# Patient Record
Sex: Female | Born: 1995 | Race: White | Hispanic: No | Marital: Single | Smoking: Never smoker
Health system: Southern US, Community
[De-identification: ages and names within clinical notes are randomized; demographics above are authoritative.]

## PROBLEM LIST (undated history)

## (undated) DIAGNOSIS — Z86711 Personal history of pulmonary embolism: Secondary | ICD-10-CM

## (undated) DIAGNOSIS — J45909 Unspecified asthma, uncomplicated: Secondary | ICD-10-CM

---
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
## 2021-03-24 ENCOUNTER — Emergency Department
Admission: EM | Admit: 2021-03-24 | Discharge: 2021-03-24 | Disposition: A | Discharge status: Home / Self Care | Payer: BC Managed Care – PPO | Attending: Student in an Organized Health Care Education/Training Program | Admitting: Student in an Organized Health Care Education/Training Program

## 2021-03-24 ENCOUNTER — Emergency Department: Payer: BC Managed Care – PPO

## 2021-03-24 DIAGNOSIS — J45909 Unspecified asthma, uncomplicated: Secondary | ICD-10-CM | POA: Diagnosis not present

## 2021-03-24 DIAGNOSIS — R059 Cough, unspecified: Secondary | ICD-10-CM | POA: Diagnosis present

## 2021-03-24 DIAGNOSIS — R0989 Other specified symptoms and signs involving the circulatory and respiratory systems: Secondary | ICD-10-CM | POA: Insufficient documentation

## 2021-03-24 HISTORY — DX: Personal history of pulmonary embolism: Z86.711

## 2021-03-24 HISTORY — DX: Unspecified asthma, uncomplicated: J45.909

## 2021-03-24 LAB — BASIC METABOLIC PANEL
Anion gap: 8 (ref 5–15)
BUN: 10 mg/dL (ref 6–20)
CO2: 24 mmol/L (ref 22–32)
Calcium: 9.5 mg/dL (ref 8.9–10.3)
Chloride: 105 mmol/L (ref 98–111)
Creatinine, Ser: 0.76 mg/dL (ref 0.44–1.00)
GFR, Estimated: >60 mL/min (ref >60)
Glucose, Bld: 103 mg/dL — ABNORMAL HIGH (ref 70–99)
Potassium: 3.7 mmol/L (ref 3.5–5.1)
Sodium: 137 mmol/L (ref 135–145)

## 2021-03-24 LAB — CBC
HCT: 42.7 % (ref 36.0–46.0)
Hemoglobin: 14.4 g/dL (ref 12.0–15.0)
MCH: 27.2 pg (ref 26.0–34.0)
MCHC: 33.7 g/dL (ref 30.0–36.0)
MCV: 80.7 fL (ref 80.0–100.0)
Platelets: 396 10*3/uL (ref 150–400)
RBC: 5.29 MIL/uL — ABNORMAL HIGH (ref 3.87–5.11)
RDW: 13.2 % (ref 11.5–15.5)
WBC: 9.2 10*3/uL (ref 4.0–10.5)
nRBC: 0 % (ref 0.0–0.2)

## 2021-03-24 LAB — TROPONIN I (HIGH SENSITIVITY): Troponin I (High Sensitivity): <2 ng/L (ref <18)

## 2021-03-24 LAB — D-DIMER, QUANTITATIVE: D-Dimer, Quant: 0.45 ug/mL-FEU (ref 0.00–0.50)

## 2021-03-24 MED ORDER — PREDNISONE 10 MG (21) PO TBPK: ORAL_TABLET | ORAL | 0 refills | Status: AC

## 2021-03-24 MED ORDER — BENZONATATE 100 MG PO CAPS: 100.0000 mg | ORAL_CAPSULE | Freq: Three times a day (TID) | ORAL | 0 refills | Status: AC | PRN

## 2021-03-24 NOTE — Discharge Instructions (Addendum)
Take tapered steroid once you have been coughing for 6 to 7 days. Take Tessalon Perles for cough.

## 2021-03-24 NOTE — ED Triage Notes (Signed)
Pt states cough and congestion. Has had 4 negative covid tests. C/o sore throat. Hx PE, takes aspirin. A&O, ambulatory. No resp distress noted.

## 2021-03-24 NOTE — ED Provider Notes (Signed)
Emergency Medicine Provider Triage Evaluation Note  Denise Mueller , a 25 y.o. female  was evaluated in triage.  Pt complains of cough, congestion and endorsed shortness of breath. Denies any pain. Symptoms x 3-4 days. Reports mild bilateral leg swelling she thinks is related to her job, no unilateral swelling. 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 She was on Xarelto for 8 months and then transitioned to aspirin..  Review of Systems  Positive: Shortness of breath, cough, congestion Negative: Fever, chest pain  Physical Exam  BP (!) 141/87 (BP Location: Left Arm)   Pulse 91   Temp 98 F (36.7 C) (Oral)   Resp 16   Ht 5\' 6"  (1.676 m)   Wt 90.7 kg   LMP 03/03/2021   SpO2 100%   BMI 32.28 kg/m  Gen:   Awake, no distress  Resp:  Normal effort  MSK:   Moves extremities without difficulty  Other:    Medical Decision Making  Medically screening exam initiated at 3:47 PM.  Appropriate orders placed.  LAQUIDA COTRELL was informed that the remainder of the evaluation will be completed by another provider, this initial triage assessment does not replace that evaluation, and the importance of remaining in the ED until their evaluation is complete.     Denise Pimple, PA 03/24/21 1549    03/26/21, MD 03/24/21 2231

## 2021-04-08 NOTE — ED Provider Notes (Signed)
[facility]-EMERGENCY DEPARTMENT  ____________________________________________  Time seen: Approximately 3:02 PM  I have reviewed the triage vital signs and the nursing notes.   HISTORY  Chief Complaint Shortness of Breath and Cough   Historian Patient     HPI Denise Mueller is a 25 y.o. female presents emergency department with persistent cough and congestion for the past 4 to 5 days.  Patient has concerns for PE as she has had a history of PE in the past.  No upper back pain or shortness of breath.  She has had low-grade fever intermittently in the past.  No chest tightness.  No nausea, vomiting or abdominal pain.   Past Medical History:  Diagnosis Date   Asthma    History of pulmonary embolus (PE)      Immunizations up to date:  Yes.     Past Medical History:  Diagnosis Date   Asthma    History of pulmonary embolus (PE)     There are no problems to display for this patient.   History reviewed. No pertinent surgical history.  Prior to Admission medications   Medication Sig Start Date End Date Taking? Authorizing Provider  predniSONE (STERAPRED UNI-PAK 21 TAB) 10 MG (21) TBPK tablet Take 6 tablets the first day, take 5 tablets the second day, take 4 tablets the third day, take 3 tablets the fourth day, take 2 tablets the fifth day, take 1 tablet the sixth day. 03/24/21  Yes Pia Mau M, PA-C    Allergies Gabapentin  History reviewed. No pertinent family history.  Social History Social History   Tobacco Use   Smoking status: Never  Substance Use Topics   Alcohol use: Not Currently     Review of Systems  Constitutional: No fever/chills Eyes:  No discharge ENT: No upper respiratory complaints. Respiratory: Patient has cough.  Gastrointestinal:   No nausea, no vomiting.  No diarrhea.  No constipation. Musculoskeletal: Negative for musculoskeletal pain. Skin: Negative for rash, abrasions, lacerations,  ecchymosis.    ____________________________________________   PHYSICAL EXAM:  VITAL SIGNS: ED Triage Vitals [03/24/21 1544]  Enc Vitals Group     BP (!) 141/87     Pulse Rate 91     Resp 16     Temp 98 F (36.7 C)     Temp Source Oral     SpO2 100 %     Weight 200 lb (90.7 kg)     Height 5\' 6"  (1.676 m)     Head Circumference      Peak Flow      Pain Score 3     Pain Loc      Pain Edu?      Excl. in GC?      Constitutional: Alert and oriented. Well appearing and in no acute distress. Eyes: Conjunctivae are normal. PERRL. EOMI. Head: Atraumatic. ENT:      Nose: No congestion/rhinnorhea.      Mouth/Throat: Mucous membranes are moist.  Neck: No stridor.  No cervical spine tenderness to palpation. Cardiovascular: Normal rate, regular rhythm. Normal S1 and S2.  Good peripheral circulation. Respiratory: Normal respiratory effort without tachypnea or retractions. Lungs CTAB. Good air entry to the bases with no decreased or absent breath sounds Gastrointestinal: Bowel sounds x 4 quadrants. Soft and nontender to palpation. No guarding or rigidity. No distention. Musculoskeletal: Full range of motion to all extremities. No obvious deformities noted Neurologic:  Normal for age. No gross focal neurologic deficits are appreciated.  Skin:  Skin is  warm, dry and intact. No rash noted. Psychiatric: Mood and affect are normal for age. Speech and behavior are normal.   ____________________________________________   LABS (all labs ordered are listed, but only abnormal results are displayed)  Labs Reviewed  BASIC METABOLIC PANEL - Abnormal; Notable for the following components:      Result Value   Glucose, Bld 103 (*)    All other components within normal limits  CBC - Abnormal; Notable for the following components:   RBC 5.29 (*)    All other components within normal limits  D-DIMER, QUANTITATIVE  TROPONIN I (HIGH SENSITIVITY)    ____________________________________________  EKG   ____________________________________________  RADIOLOGY Geraldo Pitter, personally viewed and evaluated these images (plain radiographs) as part of my medical decision making, as well as reviewing the written report by the radiologist.    No results found.  ____________________________________________    PROCEDURES  Procedure(s) performed:     Procedures     Medications - No data to display   ____________________________________________   INITIAL IMPRESSION / ASSESSMENT AND PLAN / ED COURSE  Pertinent labs & imaging results that were available during my care of the patient were reviewed by me and considered in my medical decision making (see chart for details).      Assessment and Plan:  Cough 25 year old female presents to the emergency department with persistent cough for the past 4 to 5 days and concerns for PE.  Patient's vital signs are reassuring at triage.  No tachycardia.  Patient was satting at 97% on room air.  D-dimer was within reference range.  No consolidations, opacities or infiltrates on chest x-ray.  Patient was discharged with tapered prednisone and Tessalon Perles.    ____________________________________________  FINAL CLINICAL IMPRESSION(S) / ED DIAGNOSES  Final diagnoses:  Cough      NEW MEDICATIONS STARTED DURING THIS VISIT:  ED Discharge Orders          Ordered    predniSONE (STERAPRED UNI-PAK 21 TAB) 10 MG (21) TBPK tablet        03/24/21 1828    benzonatate (TESSALON PERLES) 100 MG capsule  3 times daily PRN        03/24/21 1828                This chart was dictated using voice recognition software/Dragon. Despite best efforts to proofread, errors can occur which can change the meaning. Any change was purely unintentional.     Orvil Feil, PA-C 04/08/21 1513    Sharman Cheek, MD 04/08/21 2357
